# Patient Record
Sex: Male | Born: 1980 | Race: White | Hispanic: No | Marital: Married | Smoking: Never smoker
Health system: Southern US, Community
[De-identification: ages and names within clinical notes are randomized; demographics above are authoritative.]

## PROBLEM LIST (undated history)

## (undated) HISTORY — PX: WISDOM TOOTH EXTRACTION: SHX21

## (undated) HISTORY — PX: POPLITEAL SYNOVIAL CYST EXCISION: SUR555

---

## 2017-09-04 ENCOUNTER — Other Ambulatory Visit: Payer: Self-pay

## 2017-09-04 ENCOUNTER — Ambulatory Visit
Admission: EM | Admit: 2017-09-04 | Discharge: 2017-09-04 | Disposition: A | Discharge status: Home / Self Care | Payer: 59 | Attending: Family Medicine | Admitting: Family Medicine

## 2017-09-04 ENCOUNTER — Encounter: Payer: Self-pay | Admitting: Emergency Medicine

## 2017-09-04 DIAGNOSIS — R05 Cough: Secondary | ICD-10-CM

## 2017-09-04 DIAGNOSIS — H6691 Otitis media, unspecified, right ear: Secondary | ICD-10-CM | POA: Diagnosis not present

## 2017-09-04 DIAGNOSIS — R059 Cough, unspecified: Secondary | ICD-10-CM

## 2017-09-04 DIAGNOSIS — J0110 Acute frontal sinusitis, unspecified: Secondary | ICD-10-CM | POA: Diagnosis not present

## 2017-09-04 MED ORDER — HYDROCOD POLST-CPM POLST ER 10-8 MG/5ML PO SUER: 5.0000 mL | Freq: Every evening | ORAL | 0 refills | Status: DC | PRN

## 2017-09-04 MED ORDER — CEFDINIR 300 MG PO CAPS: 300.0000 mg | ORAL_CAPSULE | Freq: Two times a day (BID) | ORAL | 0 refills | Status: DC

## 2017-09-04 NOTE — ED Triage Notes (Signed)
Patient c/o sinus congestion and pressure and right ear pressure for the past week.  Patient denies fevers.

## 2017-09-04 NOTE — ED Provider Notes (Signed)
[facility] ____________________________________________  Time seen: Approximately 10:02 AM  I have reviewed the triage vital signs and the nursing notes.   HISTORY  Chief Complaint Otalgia (right ear) and Sinus Problem   HPI Jesse Carter is a 36 y.o. male presenting for evaluation of 1.5 weeks of nasal congestion, postnasal drainage, sinus pressure with gradual onset of cough and right ear pain.  States that he recently flew this week which cause ear pain to increase.  States cough primarily at night and interrupting sleep.  Denies known fevers.  Reports multiple sick contacts at work.  States occasionally noticed some drainage from right ear, denies a large amount of drainage or bleeding from right ear.  States ear feels clogged.  States right ear pain is mild to moderate.  States sinus pressure is mild to moderate.  States blowing nose and getting very thick greenish nasal drainage.  Reports continues to eat and drink well.  Reports his continue remain active.  Denies recent sickness.  Denies other aggravating or alleviating factors.  States has been taking some intermittent over-the-counter cough and congestion medications with minimal improvement, no resolution.  Denies chest pain, shortness of breath, abdominal pain, or rash. Denies recent sickness. Denies recent antibiotic use.  Reports penicillin allergic, states penicillin causes a rash, no swelling.  Denies any previous other allergies to antibiotic use.   History reviewed. No pertinent past medical history. Denies There are no active problems to display for this patient.   History reviewed. No pertinent surgical history.   No current facility-administered medications for this encounter.   Current Outpatient Medications:  .  cefdinir (OMNICEF) 300 MG capsule, Take 1 capsule (300 mg total) by mouth 2 (two) times daily., Disp: 20 capsule, Rfl: 0 .  chlorpheniramine-HYDROcodone (TUSSIONEX PENNKINETIC ER) 10-8 MG/5ML  SUER, Take 5 mLs by mouth at bedtime as needed for cough. do not drive or operate machinery while taking as can cause drowsiness., Disp: 75 mL, Rfl: 0  Allergies Penicillins  Family History  Problem Relation Age of Onset  . Hypertension Father     Social History Social History   Tobacco Use  . Smoking status: Never Smoker  . Smokeless tobacco: Never Used  Substance Use Topics  . Alcohol use: Yes  . Drug use: No    Review of Systems Constitutional: No fever/chills ENT initially had a sore throat, no sore throat now. Cardiovascular: Denies chest pain. Respiratory: Denies shortness of breath. Gastrointestinal: No abdominal pain.  No nausea, no vomiting.  No diarrhea.   Musculoskeletal: Negative for back pain. Skin: Negative for rash.   ____________________________________________   PHYSICAL EXAM:  VITAL SIGNS: ED Triage Vitals  Enc Vitals Group     BP 09/04/17 0939 122/75     Pulse Rate 09/04/17 0939 85     Resp 09/04/17 0939 16     Temp 09/04/17 0939 98.3 F (36.8 C)     Temp Source 09/04/17 0939 Oral     SpO2 09/04/17 0939 98 %     Weight 09/04/17 0935 240 lb (108.9 kg)     Height 09/04/17 0935 6\' 1"  (1.854 m)     Head Circumference --      Peak Flow --      Pain Score 09/04/17 0936 8     Pain Loc --      Pain Edu? --      Excl. in [place]? --    Constitutional: Alert and oriented. Well appearing and in no acute distress. Eyes:  Conjunctivae are normal.  Head: Atraumatic.Mild tenderness to palpation bilateral frontal and nontender maxillary sinuses. No swelling. No erythema.   Ears: Left: Nontender, no erythema, normal canal, normal TM.  Right: Nontender, normal canal, no drainage, moderate erythema and dull TM, TM appears intact.  No surrounding redness, swelling or erythema bilaterally.  Nose: nasal congestion with bilateral nasal turbinate erythema and edema.   Mouth/Throat: Mucous membranes are moist. Oropharynx non-erythematous. No tonsillar swelling or  exudate.  Neck: No stridor.  No cervical spine tenderness to palpation.  Hematological/Lymphatic/Immunilogical: No cervical lymphadenopathy. Cardiovascular: Normal rate, regular rhythm. Grossly normal heart sounds.  Good peripheral circulation. Respiratory: Normal respiratory effort.  No retractions. No wheezes, rales or rhonchi. Good air movement.  Gastrointestinal: Soft and nontender.  Musculoskeletal: No cervical, thoracic or lumbar tenderness to palpation.  Neurologic:  Normal speech and language. No gross focal neurologic deficits are appreciated. No gait instability. Skin:  Skin is warm, dry and intact. No rash noted. Psychiatric: Mood and affect are normal. Speech and behavior are normal.  ___________________________________________   LABS (all labs ordered are listed, but only abnormal results are displayed)  Labs Reviewed - No data to display   PROCEDURES Procedures   INITIAL IMPRESSION / ASSESSMENT AND PLAN / ED COURSE  Pertinent labs & imaging results that were available during my care of the patient were reviewed by me and considered in my medical decision making (see chart for details).  Well-appearing patient.  No acute distress. Suspect sinusitis and patient with right otitis media.  No TM rupture.  Patient reports previous penicillin allergy, rash only.  Will treat patient with oral Cefdinir, cautioned regarding monitoring for any reaction. Encourage over-the-counter Sudafed, encourage rest, fluids, supportive care, as needed Tussionex. Discussed indication, risks and benefits of medications with patient.   Discussed follow up with Primary care physician this week. Discussed follow up and return parameters including no resolution or any worsening concerns. Patient verbalized understanding and agreed to plan.   ____________________________________________   FINAL CLINICAL IMPRESSION(S) / ED DIAGNOSES  Final diagnoses:  Acute frontal sinusitis, recurrence not  specified  Right otitis media, unspecified otitis media type  Cough     ED Discharge Orders        Ordered    cefdinir (OMNICEF) 300 MG capsule  2 times daily     09/04/17 1010    chlorpheniramine-HYDROcodone (TUSSIONEX PENNKINETIC ER) 10-8 MG/5ML SUER  At bedtime PRN     09/04/17 1010       Note: This dictation was prepared with Dragon dictation along with smaller phrase technology. Any transcriptional errors that result from this process are unintentional.         Renford DillsMiller, [place] Linhart, NP 09/04/17 1025

## 2017-09-04 NOTE — Discharge Instructions (Signed)
Take medication as prescribed. Rest. Drink plenty of fluids.  ° °Follow up with your primary care physician this week as needed. Return to Urgent care for new or worsening concerns.  ° °

## 2019-08-31 ENCOUNTER — Ambulatory Visit
Admission: EM | Admit: 2019-08-31 | Discharge: 2019-08-31 | Disposition: A | Discharge status: Home / Self Care | Payer: BC Managed Care – PPO | Attending: Family Medicine | Admitting: Family Medicine

## 2019-08-31 ENCOUNTER — Other Ambulatory Visit: Payer: Self-pay

## 2019-08-31 DIAGNOSIS — R438 Other disturbances of smell and taste: Secondary | ICD-10-CM | POA: Diagnosis not present

## 2019-08-31 DIAGNOSIS — R05 Cough: Secondary | ICD-10-CM

## 2019-08-31 DIAGNOSIS — R0981 Nasal congestion: Secondary | ICD-10-CM

## 2019-08-31 DIAGNOSIS — J3489 Other specified disorders of nose and nasal sinuses: Secondary | ICD-10-CM | POA: Diagnosis not present

## 2019-08-31 DIAGNOSIS — U071 COVID-19: Secondary | ICD-10-CM

## 2019-08-31 LAB — SARS CORONAVIRUS 2 AG (30 MIN TAT): SARS Coronavirus 2 Ag: POSITIVE — AB

## 2019-08-31 NOTE — ED Triage Notes (Signed)
Patient states that he has cough, sinus pressure and pain, loss of taste and smell. Exposure from friends.

## 2019-08-31 NOTE — ED Provider Notes (Signed)
[facility]    CSN: [id number] Arrival date & time: 08/31/19  1506      History   Chief Complaint Chief Complaint  Patient presents with  . Cough    Appt    HPI  38 year old male presents with the above complaint.  Patient reports that he has been experiencing symptoms since Friday.  Reports cough, sinus pressure and congestion, and more recently loss of taste and smell.  He has had exposure to COVID-19.  No documented fever.  His wife has been sick as well.  No medications or interventions tried.  No known exacerbating/relieving factors.  Concern for COVID-19.  No other complaints.  Hx reviewed as below. PMH: Obesity  Surgical Hx: Removal of Baker's cyst   Home Medications    Prior to Admission medications   Not on File    Family History Family History  Problem Relation Age of Onset  . Hypertension Father     Social History Social History   Tobacco Use  . Smoking status: Never Smoker  . Smokeless tobacco: Never Used  Substance Use Topics  . Alcohol use: Yes  . Drug use: No     Allergies   Penicillins   Review of Systems Review of Systems  Constitutional: Negative for fever.  HENT: Positive for congestion, sinus pressure and sinus pain.        Loss of taste and smell.  Respiratory: Negative.    Physical Exam Triage Vital Signs ED Triage Vitals  Enc Vitals Group     BP 08/31/19 1527 132/90     Pulse Rate 08/31/19 1527 (!) 101     Resp 08/31/19 1527 18     Temp 08/31/19 1527 98.4 F (36.9 C)     Temp Source 08/31/19 1527 Oral     SpO2 08/31/19 1527 97 %     Weight --      Height 08/31/19 1528 6\' 1"  (1.854 m)     Head Circumference --      Peak Flow --      Pain Score 08/31/19 1528 0     Pain Loc --      Pain Edu? --      Excl. in [place]? --    Updated Vital Signs BP 132/90 (BP Location: Left Arm)   Pulse (!) 101   Temp 98.4 F (36.9 C) (Oral)   Resp 18   Ht 6\' 1"  (1.854 m)   SpO2 97%   BMI 31.66 kg/m   Visual Acuity  Right Eye Distance:   Left Eye Distance:   Bilateral Distance:    Right Eye Near:   Left Eye Near:    Bilateral Near:     Physical Exam Vitals signs and nursing note reviewed.  Constitutional:      General: He is not in acute distress.    Appearance: Normal appearance. He is not ill-appearing.  HENT:     Head: Normocephalic and atraumatic.  Eyes:     General:        Right eye: No discharge.        Left eye: No discharge.     Conjunctiva/sclera: Conjunctivae normal.  Cardiovascular:     Rate and Rhythm: Regular rhythm. Tachycardia present.  Pulmonary:     Effort: Pulmonary effort is normal.     Breath sounds: Normal breath sounds. No wheezing, rhonchi or rales.  Neurological:     Mental Status: He is alert.  Psychiatric:  Mood and Affect: Mood normal.        Behavior: Behavior normal.    UC Treatments / Results  Labs (all labs ordered are listed, but only abnormal results are displayed) Labs Reviewed  SARS CORONAVIRUS 2 AG (30 MIN TAT) - Abnormal; Notable for the following components:      Result Value   SARS Coronavirus 2 Ag POSITIVE (*)    All other components within normal limits    EKG   Radiology No results found.  Procedures Procedures (including critical care time)  Medications Ordered in UC Medications - No data to display  Initial Impression / Assessment and Plan / UC Course  I have reviewed the triage vital signs and the nursing notes.  Pertinent labs & imaging results that were available during my care of the patient were reviewed by me and considered in my medical decision making (see chart for details).    38 year old male presents with COVID-19.  Work note given. Precautions given. Supportive care. His contacts are coming in for testing today. Advised that his family needs to be tested.  Final Clinical Impressions(s) / UC Diagnoses   Final diagnoses:  COVID-19   Discharge Instructions   None    ED Prescriptions    None      PDMP not reviewed this encounter.   Tommie Sams, [place] 08/31/19 2100

## 2019-09-09 ENCOUNTER — Ambulatory Visit
Admission: RE | Admit: 2019-09-09 | Discharge: 2019-09-09 | Disposition: A | Discharge status: Home / Self Care | Payer: BC Managed Care – PPO | Source: Ambulatory Visit | Attending: Internal Medicine | Admitting: Internal Medicine

## 2019-09-09 ENCOUNTER — Other Ambulatory Visit: Payer: Self-pay

## 2019-09-09 ENCOUNTER — Other Ambulatory Visit: Payer: Self-pay | Admitting: Internal Medicine

## 2019-09-09 DIAGNOSIS — U071 COVID-19: Secondary | ICD-10-CM

## 2019-09-09 DIAGNOSIS — J208 Acute bronchitis due to other specified organisms: Secondary | ICD-10-CM | POA: Insufficient documentation

## 2019-09-09 DIAGNOSIS — I2699 Other pulmonary embolism without acute cor pulmonale: Secondary | ICD-10-CM | POA: Diagnosis present

## 2019-09-09 MED ORDER — IOHEXOL 350 MG/ML SOLN
75.0000 mL | Freq: Once | INTRAVENOUS | Status: AC | PRN
  Administered 2019-09-09: 14:00:00 75 mL via INTRAVENOUS

## 2020-05-06 IMAGING — CT CT ANGIO CHEST
2 of 6 series · 18 of 46 positions shown · IV contrast (APPLIED)
Comparison: None.

CLINICAL DATA: [id number] positive.  Shortness of breath

EXAM:
CT ANGIOGRAPHY CHEST WITH CONTRAST
TECHNIQUE: Multidetector CT imaging of the chest was performed using the
standard protocol during bolus administration of intravenous
contrast. Multiplanar CT image reconstructions and MIPs were
obtained to evaluate the vascular anatomy.
CONTRAST:  75mL OMNIPAQUE IOHEXOL 350 MG/ML SOLN

[Series 5: thins · axial · 0.74mm/px · z∈[-294,-25]mm · 16 of 295 slices shown]
[im 13/295  lung]
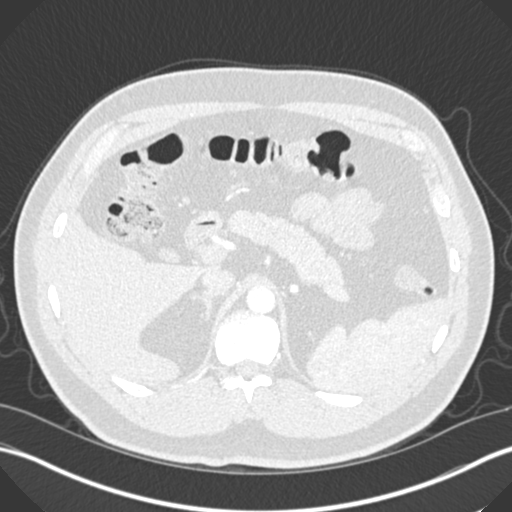
[im 39/295  soft-tissue]
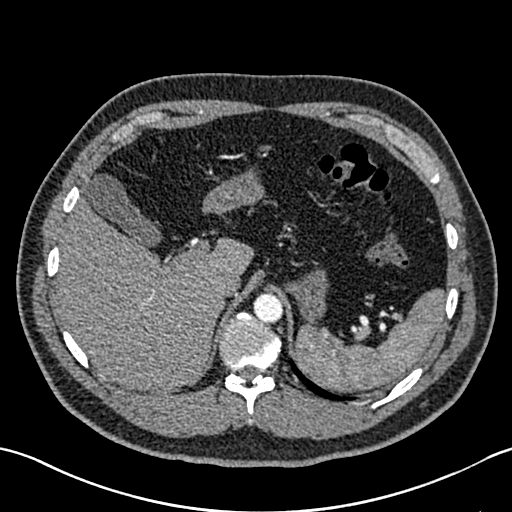
[im 52/295  lung]
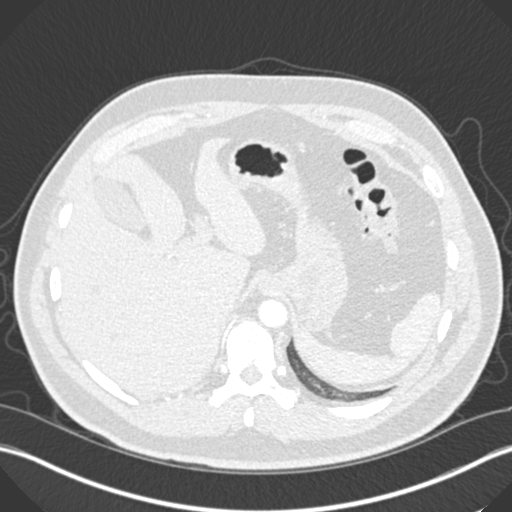
[im 64/295  soft-tissue]
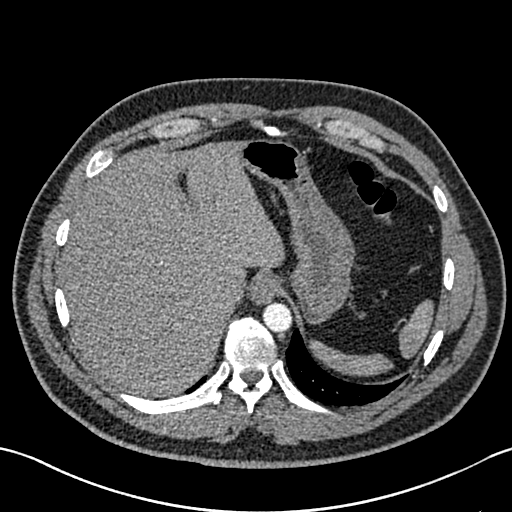
[im 90/295  lung]
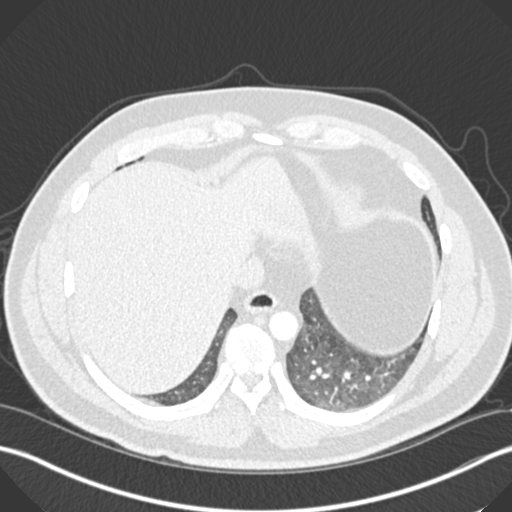
[im 103/295  soft-tissue]
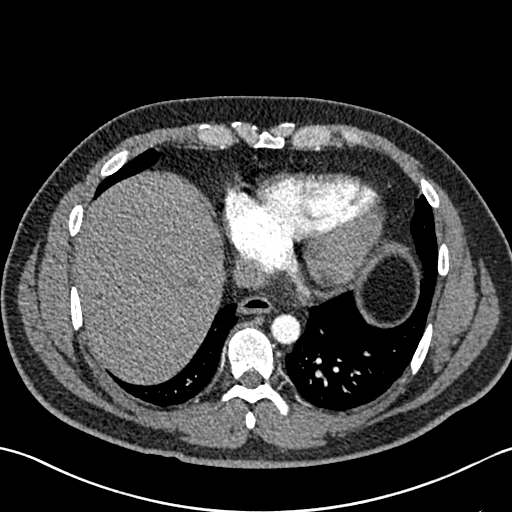
[im 116/295  lung]
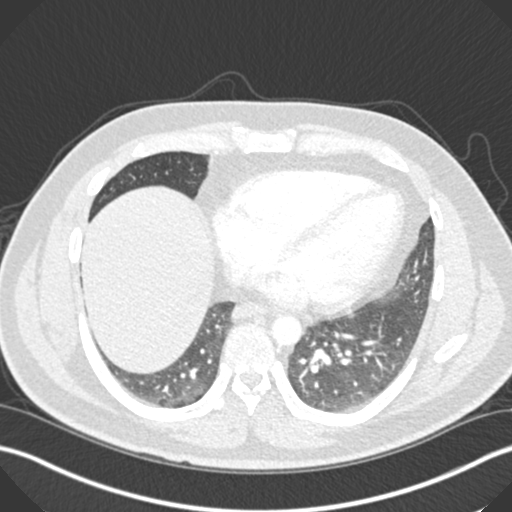
[im 141/295  soft-tissue]
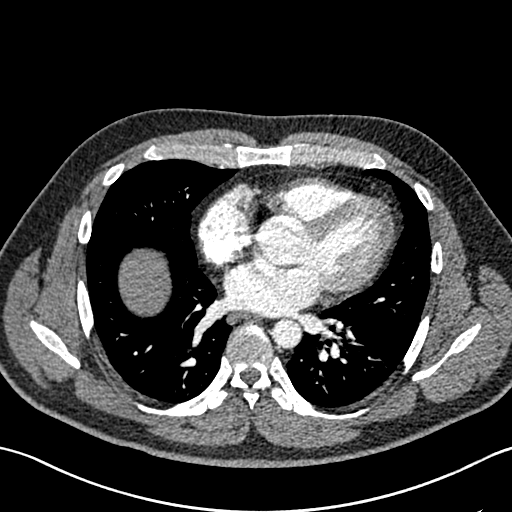
[im 154/295  lung]
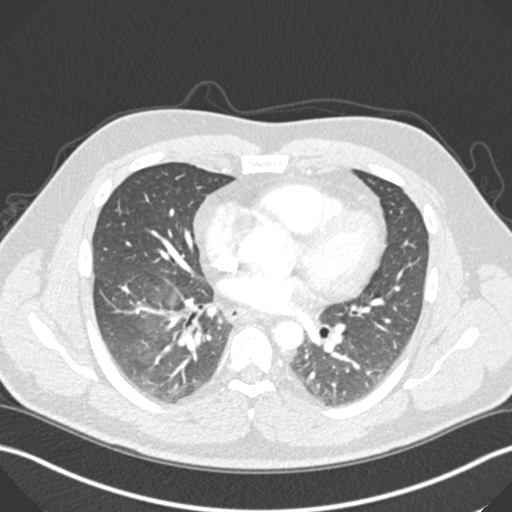
[im 179/295  soft-tissue]
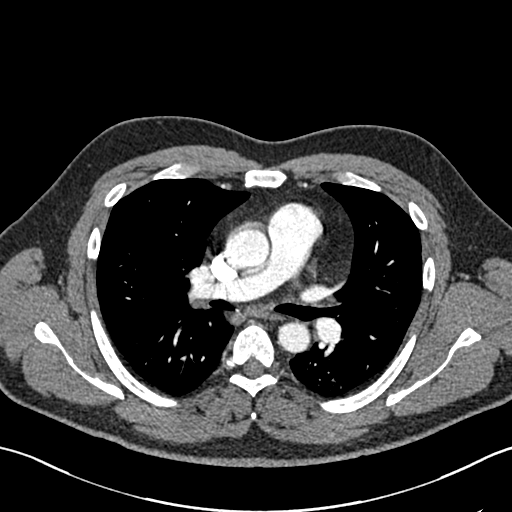
[im 192/295  lung]
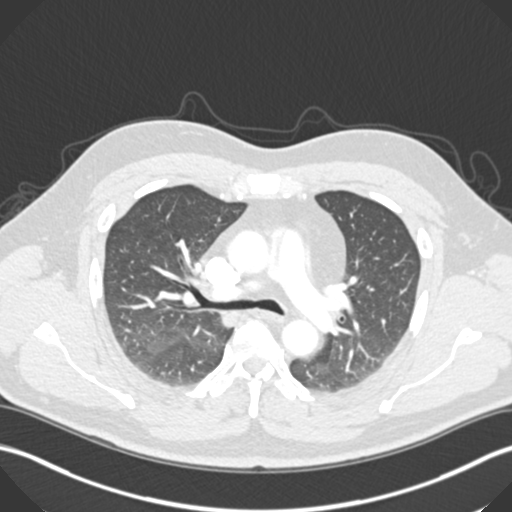
[im 205/295  soft-tissue]
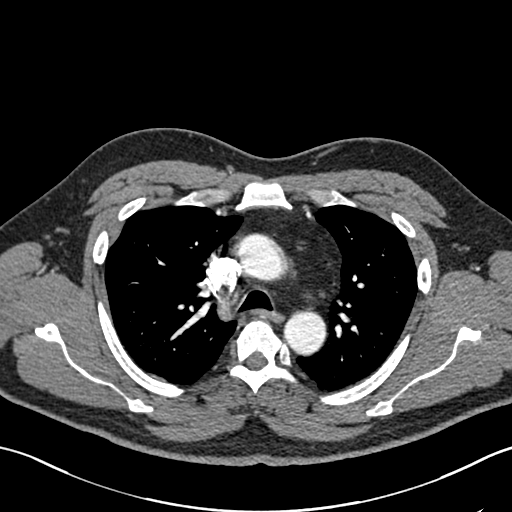
[im 231/295  lung]
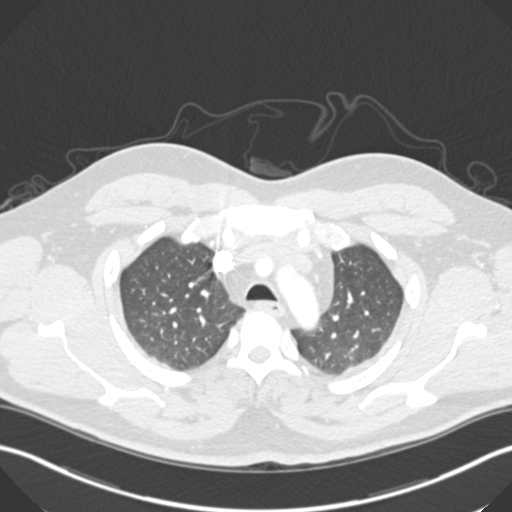
[im 243/295  soft-tissue]
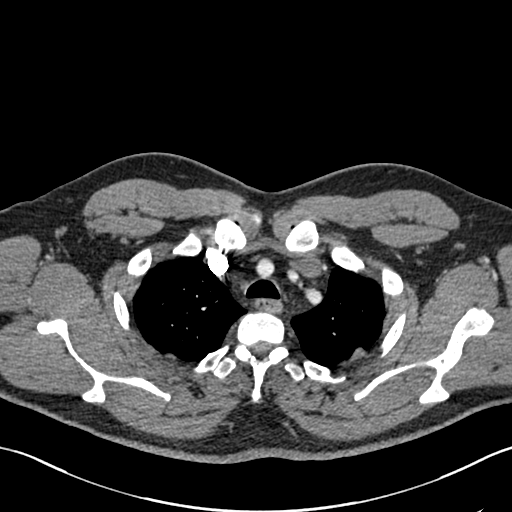
[im 256/295  lung]
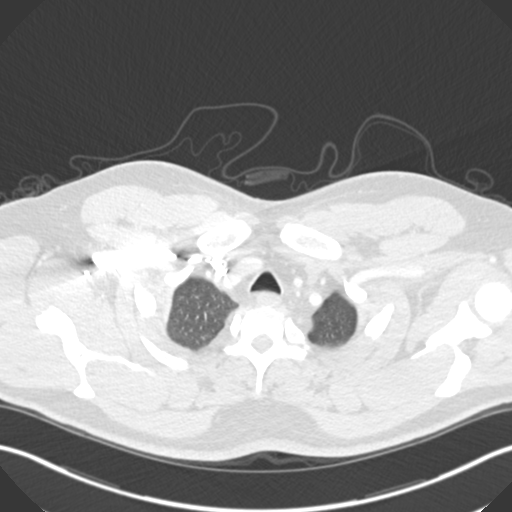
[im 282/295  soft-tissue]
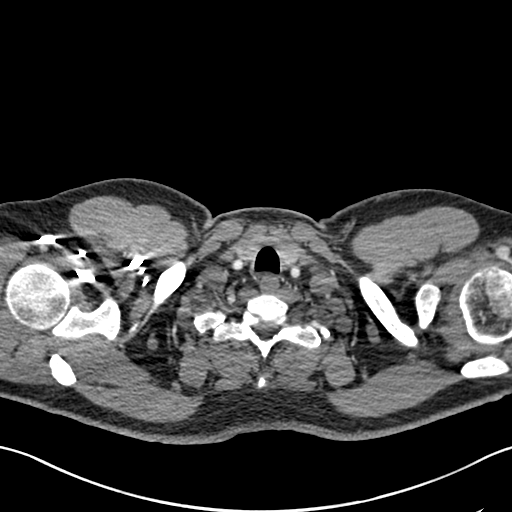

[Series 7: coronal mpr · coronal · 0.58mm/px · 2 of 89 slices shown]
[im 30/89  soft-tissue]
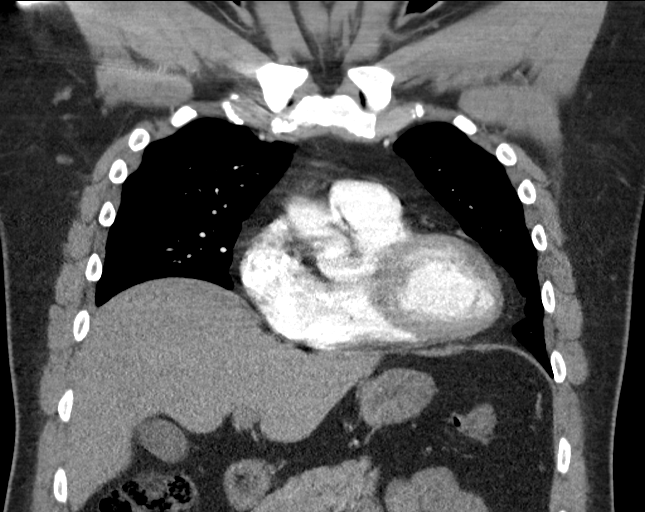
[im 59/89  soft-tissue]
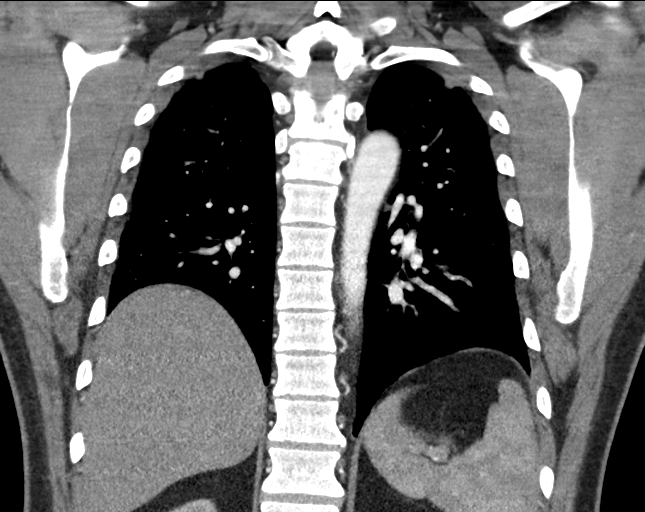

[18 of 46 positions shown; findings below may reference images not displayed]

FINDINGS: Cardiovascular: There is no demonstrable pulmonary embolus. There is
no thoracic aortic aneurysm or dissection. The visualized great
vessels appear normal. There is no appreciable pericardial effusion
or pericardial thickening.

Mediastinum/Nodes: Visualized thyroid appears normal. There is no
appreciable thoracic adenopathy. No esophageal lesions are
appreciable.

Lungs/Pleura: There is mild lower lobe atelectatic change. There is
no appreciable ground-glass type opacity or airspace consolidation.
No interstitial thickening evident. No pleural effusions.

Upper Abdomen: There are scattered subcentimeter apparent cysts
throughout the liver. Visualized upper abdominal structures
otherwise appear normal.

Musculoskeletal: There are no blastic or lytic bone lesions. No
chest wall lesions are evident.

Review of the MIP images confirms the above findings.
IMPRESSION: 1. No demonstrable pulmonary embolus. No thoracic aortic aneurysm or
dissection.

2. Areas of lower lobe atelectasis. No airspace consolidation or
ground-glass type opacity evident. No pleural effusions.

3.  No evident adenopathy.

4.  Probable small cysts throughout the liver.

## 2021-07-25 ENCOUNTER — Ambulatory Visit: Payer: Self-pay | Admitting: General Surgery

## 2021-07-25 NOTE — H&P (Signed)
PATIENT PROFILE: Jesse Carter is a 40 y.o. male who presents to the Clinic for consultation at the request of Dr. Sabra Heck for evaluation of umbilical hernia.  PCP:  Yevonne Pax, MD  HISTORY OF PRESENT ILLNESS: Jesse Carter reports having an umbilical hernia since 2 years ago.  He endorses that he has been having more discomfort on the periumbilical area.  No pain radiation.  Aggravating factor is certain abdominal wall movement and lifting.  No alleviating factors.  Pain does not radiate to other part of the body.  Patient denies any previous surgery on the periumbilical area.  Patient denies any episode of abdominal distention nausea or vomiting.   PROBLEM LIST: Problem List  Date Reviewed: 09/09/2019          Noted   B12 deficiency 11/28/2020   Overview    283, 2/22      History of 2019 novel coronavirus disease (COVID-19) 09/09/2019   Overview    12/20, 1/22          GENERAL REVIEW OF SYSTEMS:   General ROS: negative for - chills, fatigue, fever, weight gain or weight loss Allergy and Immunology ROS: negative for - hives  Hematological and Lymphatic ROS: negative for - bleeding problems or bruising, negative for palpable nodes Endocrine ROS: negative for - heat or cold intolerance, hair changes Respiratory ROS: negative for - cough, shortness of breath or wheezing Cardiovascular ROS: no chest pain or palpitations GI ROS: negative for nausea, vomiting, abdominal pain, diarrhea, constipation Musculoskeletal ROS: negative for - joint swelling or muscle pain Neurological ROS: negative for - confusion, syncope Dermatological ROS: negative for pruritus and rash Psychiatric: negative for anxiety, depression, difficulty sleeping and memory loss  MEDICATIONS: Current Outpatient Medications  Medication Sig Dispense Refill   ascorbic acid, vitamin C, (VITAMIN C) 1000 MG tablet Take 1,000 mg by mouth once daily     cholecalciferol (VITAMIN D3) 1000 unit tablet Take by  mouth 4,ooo     Herbal Supplement Herbal Name:  Multi vitamin and zinc.     No current facility-administered medications for this visit.    ALLERGIES: Penicillin and Trimox [amoxicillin]  PAST MEDICAL HISTORY: History reviewed. No pertinent past medical history.  PAST SURGICAL HISTORY: Past Surgical History:  Procedure Laterality Date   bakers cyst removed       FAMILY HISTORY: Family History  Problem Relation Age of Onset   No Known Problems Mother    High blood pressure (Hypertension) Father      SOCIAL HISTORY: Social History   Socioeconomic History   Marital status: Married  Tobacco Use   Smoking status: Never Smoker   Smokeless tobacco: Never Used  Scientific laboratory technician Use: Never used  Substance and Sexual Activity   Alcohol use: Yes    Alcohol/week: 0.0 standard drinks    Comment: occasional   Drug use: Never   Sexual activity: Defer    Partners: Female    PHYSICAL EXAM: Vitals:   07/25/21 1355  BP: 135/85  Pulse: 75   Body mass index is 33.51 kg/m. Weight: (!) 115.2 kg (254 lb)   GENERAL: Alert, active, oriented x3  HEENT: Pupils equal reactive to light. Extraocular movements are intact. Sclera clear. Palpebral conjunctiva normal red color.Pharynx clear.  NECK: Supple with no palpable mass and no adenopathy.  LUNGS: Sound clear with no rales rhonchi or wheezes.  HEART: Regular rhythm S1 and S2 without murmur.  ABDOMEN: Soft and depressible, nontender with no palpable mass,  no hepatomegaly.  2 cm umbilical hernia defect, reducible.  EXTREMITIES: Well-developed well-nourished symmetrical with no dependent edema.  NEUROLOGICAL: Awake alert oriented, facial expression symmetrical, moving all extremities.  REVIEW OF DATA: I have reviewed the following data today: Appointment on 07/17/2021  Component Date Value   Hemoglobin A1C 07/17/2021 5.7 (!)   Average Blood Glucose (C* 07/17/2021 117    Glucose 07/17/2021 100    Sodium 07/17/2021 141     Potassium 07/17/2021 4.1    Chloride 07/17/2021 102    Carbon Dioxide (CO2) 07/17/2021 30.4    Urea Nitrogen (BUN) 07/17/2021 17    Creatinine 07/17/2021 1.1    Glomerular Filtration Ra* 07/17/2021 75    Calcium 07/17/2021 9.5    AST  07/17/2021 16    ALT  07/17/2021 28    Alk Phos (alkaline Phosp* 07/17/2021 99    Albumin 07/17/2021 4.6    Bilirubin, Total 07/17/2021 0.8    Protein, Total 07/17/2021 6.8    A/G Ratio 07/17/2021 2.1    Hemoglobin 07/17/2021 16.4    Vitamin B12 07/17/2021 269 (!)     ASSESSMENT: Jesse Carter is a 40 y.o. male presenting for consultation for umbilical hernia.    The patient presents with a symptomatic umbilical hernia. Patient was oriented about the diagnosis of umbilical hernia and its implication. The patient was oriented about the treatment alternatives (observation vs surgical repair). Due to patient symptoms, repair is recommended. Patient oriented about the surgical procedure, the use of mesh and its risk of complications such as: infection, bleeding, injury to vasculature, injury to bowel or bladder, and chronic pain, intestinal obstruction, among others.   Umbilical hernia without obstruction and without gangrene [K42.9]  PLAN: Robotic assisted laparoscopic umbilical hernia repair with mesh (79024) Avoid taking aspirin 5 days before the procedure Contact us if you have any concern.   Patient verbalized understanding, all questions were answered, and were agreeable with the plan outlined above.    Herbert Pun, MD  Electronically signed by Herbert Pun, MD

## 2021-08-08 ENCOUNTER — Other Ambulatory Visit
Admission: RE | Admit: 2021-08-08 | Discharge: 2021-08-08 | Disposition: A | Discharge status: Home / Self Care | Payer: BC Managed Care – PPO | Source: Ambulatory Visit | Attending: General Surgery | Admitting: General Surgery

## 2021-08-08 NOTE — Patient Instructions (Addendum)
Your procedure is scheduled on:  Friday 08/15/21 Report to the Registration Desk on the 1st floor of the [facility]. To find out your arrival time, please call [phone] between 1PM - 3PM on: Thursday 08/14/21  REMEMBER: Instructions that are not followed completely may result in serious medical risk, up to and including death; or upon the discretion of your surgeon and anesthesiologist your surgery may need to be rescheduled.  Do not eat food after midnight the night before surgery.  No gum chewing, lozengers or hard candies.  TAKE THESE MEDICATIONS THE MORNING OF SURGERY WITH A SIP OF WATER: None  One week prior to surgery: Stop Anti-inflammatories (NSAIDS) such as Advil, Aleve, Ibuprofen, Motrin, Naproxen, Naprosyn and Aspirin based products such as Excedrin, Goodys Powder, BC Powder. Stop taking Ascorbic Acid (VITAMIN C PO, Cholecalciferol (VITAMIN D3 PO), Multiple Vitamins-Minerals (MULTIVITAMIN WITH MINERALS) tablet, Probiotic Product (PROBIOTIC DAILY PO and zinc gluconate 50 MG tablet and ANY OVER THE COUNTER supplements until after surgery. You may however, continue to take Tylenol if needed for pain up until the day of surgery.  No Alcohol for 24 hours before or after surgery.  No Smoking including e-cigarettes for 24 hours prior to surgery.  No chewable tobacco products for at least 6 hours prior to surgery.  No nicotine patches on the day of surgery.  Do not use any "recreational" drugs for at least a week prior to your surgery.  Please be advised that the combination of cocaine and anesthesia may have negative outcomes, up to and including death. If you test positive for cocaine, your surgery will be cancelled.  On the morning of surgery brush your teeth with toothpaste and water, you may rinse your mouth with mouthwash if you wish. Do not swallow any toothpaste or mouthwash.  Use antibacterial soap the day prior to and day of surgery.  Do not wear jewelry.  Do not  wear lotions, powders, or cologne.   Do not shave body from the neck down 48 hours prior to surgery just in case you cut yourself which could leave a site for infection.  Also, freshly shaved skin may become irritated if using the CHG soap.  Contact lenses, hearing aids and dentures may not be worn into surgery.  Do not bring valuables to the hospital. [facility] is not responsible for any missing/lost belongings or valuables.   Notify your doctor if there is any change in your medical condition (cold, fever, infection).  Wear comfortable clothing (specific to your surgery type) to the hospital.  After surgery, you can help prevent lung complications by doing breathing exercises.  Take deep breaths and cough every 1-2 hours. Your doctor may order a device called an Incentive Spirometer to help you take deep breaths. When coughing or sneezing, hold a pillow firmly against your incision with both hands. This is called "splinting." Doing this helps protect your incision. It also decreases belly discomfort.  If you are being discharged the day of surgery, you will not be allowed to drive home. You will need a responsible adult (18 years or older) to drive you home and stay with you that night.   If you are taking public transportation, you will need to have a responsible adult (18 years or older) with you. Please confirm with your physician that it is acceptable to use public transportation.   Please call the Pre-admissions Testing Dept. at [phone] if you have any questions about these instructions.  Surgery Visitation Policy:  Patients undergoing a surgery or procedure may have one family member or support person with them as long as that person is not COVID-19 positive or experiencing its symptoms.  That person may remain in the waiting area during the procedure and may rotate out with other people.  Inpatient Visitation:    Visiting hours are 7 a.m. to 8 p.m. Up to two  visitors ages 16+ are allowed at one time in a patient room. The visitors may rotate out with other people during the day. Visitors must check out when they leave, or other visitors will not be allowed. One designated support person may remain overnight. The visitor must pass COVID-19 screenings, use hand sanitizer when entering and exiting the patient's room and wear a mask at all times, including in the patient's room. Patients must also wear a mask when staff or their visitor are in the room. Masking is required regardless of vaccination status.

## 2021-09-11 ENCOUNTER — Encounter
Admission: RE | Admit: 2021-09-11 | Discharge: 2021-09-11 | Disposition: A | Discharge status: Home / Self Care | Payer: BC Managed Care – PPO | Source: Ambulatory Visit | Attending: General Surgery | Admitting: General Surgery

## 2021-09-11 ENCOUNTER — Other Ambulatory Visit: Payer: Self-pay

## 2021-09-11 NOTE — Progress Notes (Signed)
  Perioperative Services Pre-Admission/Anesthesia Testing     Date: 09/11/21  Name: Jesse Carter MRN:   [id number]  Re: Change in ABX for upcoming surgery   Case: [id number] Date/Time: 09/14/21 0715   Procedure: XI ROBOT ASSISTED UMBILICAL HERNIA REPAIR (Abdomen)   Anesthesia type: General   Pre-op diagnosis: K42.9 Umbilical hernia w/o obstruction   Location: [facility] OR ROOM 06 / [facility]   Surgeons: Carolan Shiver, MD   Primary attending surgeon was consulted regarding consideration of therapeutic change in antimicrobial agent being used for preoperative prophylaxis in this patient's upcoming surgical case. Following analysis of the risk versus benefits, the patient's primary attending surgeon advised that it would be acceptable to discontinue the ordered clindamycin and place an order for cefazolin 2 gm IV on call to the OR. Orders for this patient were amended by me following collaborative conversation with attending surgeon taking into consideration of risk versus benefits associated with the change in therapy.  Quentin Mulling, MSN, APRN, FNP-C, CEN [facility]  Peri-operative Services Nurse Practitioner Phone: [phone] 09/11/21 11:23 AM

## 2021-09-11 NOTE — Patient Instructions (Signed)
Your procedure is scheduled on: 09/14/21 Report to DAY SURGERY DEPARTMENT LOCATED ON 2ND FLOOR MEDICAL MALL ENTRANCE. To find out your arrival time please call [phone] between 1PM - 3PM on 09/13/21.  Remember: Instructions that are not followed completely may result in serious medical risk, up to and including death, or upon the discretion of your surgeon and anesthesiologist your surgery may need to be rescheduled.     _X__ 1. Do not eat food after midnight the night before your procedure.                 No gum chewing or hard candies. You may drink clear liquids up to 2 hours                 before you are scheduled to arrive for your surgery- DO not drink clear                 liquids within 2 hours of the start of your surgery.                 Clear Liquids include:  water, apple juice without pulp, clear carbohydrate                 drink such as Clearfast or Gatorade, Black Coffee or Tea (Do not add                 anything to coffee or tea). Diabetics water only  __X__2.  On the morning of surgery brush your teeth with toothpaste and water, you                 may rinse your mouth with mouthwash if you wish.  Do not swallow any              toothpaste of mouthwash.     _X__ 3.  No Alcohol for 24 hours before or after surgery.   _X__ 4.  Do Not Smoke or use e-cigarettes For 24 Hours Prior to Your Surgery.                 Do not use any chewable tobacco products for at least 6 hours prior to                 surgery.  ____  5.  Bring all medications with you on the day of surgery if instructed.   __X__  6.  Notify your doctor if there is any change in your medical condition      (cold, fever, infections).     Do not wear jewelry, make-up, hairpins, clips or nail polish. Do not wear lotions, powders, or perfumes.  Do not shave 48 hours prior to surgery. Men may shave face and neck. Do not bring valuables to the hospital.    [facility] is not responsible for any belongings or  valuables.  Contacts, dentures/partials or body piercings may not be worn into surgery. Bring a case for your contacts, glasses or hearing aids, a denture cup will be supplied. Leave your suitcase in the car. After surgery it may be brought to your room. For patients admitted to the hospital, discharge time is determined by your treatment team.   Patients discharged the day of surgery will not be allowed to drive home.   Please read over the following fact sheets that you were given:     __X__ Take these medicines the morning of surgery with A SIP OF WATER:  1. none  2.   3.   4.  5.  6.  ____ Fleet Enema (as directed)   ____ Use CHG Soap/SAGE wipes as directed  ____ Use inhalers on the day of surgery  ____ Stop metformin/Janumet/Farxiga 2 days prior to surgery    ____ Take 1/2 of usual insulin dose the night before surgery. No insulin the morning          of surgery.   ____ Stop Blood Thinners Coumadin/Plavix/Xarelto/Pleta/Pradaxa/Eliquis/Effient/Aspirin  on   Or contact your Surgeon, Cardiologist or Medical Doctor regarding  ability to stop your blood thinners  __X__ Stop Anti-inflammatories 7 days before surgery such as Advil, Ibuprofen, Motrin,  BC or Goodies Powder, Naprosyn, Naproxen, Aleve, Aspirin    __X__ Stop all herbal supplements, fish oil or vitamin E until after surgery.  Stop all supplements today 09/11/21  ____ Bring C-Pap to the hospital.    Shower with "Synetta Shadow" Dial soap starting today thru the morning of surgery.

## 2021-09-11 NOTE — Progress Notes (Signed)
  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   Received cephalosporin with no documented complications Received CEFDINIR on 09/04/2017 without documented ADRs.   Screened as appropriate for cephalosporin use during medication reconciliation No immediate angioedema, dysphagia, SOB, anaphylaxis symptoms. No severe rash involving mucous membranes or skin necrosis. No hospital admissions related to side effects of PCN/cephalosporin use.   Request:  As an evidence based approach to reducing the rate of incidence for post-operative SSI and the development of MDROs, could an agent with narrower coverage for preoperative prophylaxis in this patient's upcoming surgical course be considered?   Currently ordered preoperative prophylactic ABX: clindamycin.   Specifically requesting change to cephalosporin (CEFAZOLIN).   Please communicate decision with me and I will change the orders in Epic as per your direction.   Things to consider: Many patients report that they were "allergic" to PCN earlier in life, however this does not translate into a true lifelong allergy. Patients can lose sensitivity to specific IgE antibodies over time if PCN is avoided (Kleris & Lugar, 2019).  Up to 10% of the adult population and 15%  of hospitalized patients report an allergy to PCN, however clinical studies suggest that 90% of those reporting an allergy can tolerate PCN antibiotics (Kleris & Lugar, 2019).  Cross-sensitivity between PCN and cephalosporins has been documented as being as high as 10%, however this estimation included data believed to have been collected in a setting where there was contamination. Newer data suggests that the prevalence of cross-sensitivity between PCN and cephalosporins is actually estimated to be closer to 1% (Hermanides et al., 2018).   Patients labeled as PCN allergic, whether they are truly allergic or not, have been found to have inferior outcomes in terms of rates of serious infection, and these patients tend to have longer hospital stays [facility] & Lugar, 2019).  Treatment related secondary infections, such as Clostridioides difficile, have been linked to the improper use of broad spectrum antibiotics in patients improperly labeled as PCN allergic (Kleris & Lugar, 2019).  Anaphylaxis from cephalosporins is rare and the evidence suggests that there is no increased risk of an anaphylactic type reaction when cephalosporins are used in a PCN allergic patient (Pichichero, 2006).  Citations: Hermanides J, Lemkes BA, Prins Gwenyth Bender MW, Terreehorst I. Presumed ?-Lactam Allergy and Cross-reactivity in the Operating Theater: A Practical Approach. Anesthesiology. 2018 Aug;129(2):335-342. doi: 10.1097/ALN.0000000000002252. PMID: 75170017.  Kleris, R. S., & Lugar, P. L. (2019). Things We Do For No Reason: Failing to Question a Penicillin Allergy History. Journal of hospital medicine, 14(10), 9161973113. Advance online publication. airportbarriers.com  Pichichero, M. E. (2006). Cephalosporins can be prescribed safely for penicillin-allergic patients. Journal of family medicine, 55(2), 106-112. Accessed: https://cdn.mdedge.com/files/s66fs-public/Document/September-2017/5502JFP_AppliedEvidence1.pdf    Quentin Mulling, MSN, APRN, FNP-C, CEN [facility]  Peri-operative Services Nurse Practitioner FAX: [phone] 09/11/21 10:56 AM

## 2021-09-13 MED ORDER — LACTATED RINGERS IV SOLN: INTRAVENOUS | Status: DC

## 2021-09-13 MED ORDER — CHLORHEXIDINE GLUCONATE 0.12 % MT SOLN
15.0000 mL | Freq: Once | OROMUCOSAL | Status: AC
  Administered 2021-09-14: 15 mL via OROMUCOSAL

## 2021-09-13 MED ORDER — ORAL CARE MOUTH RINSE: 15.0000 mL | Freq: Once | OROMUCOSAL | Status: AC

## 2021-09-13 MED ORDER — CEFAZOLIN SODIUM-DEXTROSE 2-4 GM/100ML-% IV SOLN
2.0000 g | Freq: Once | INTRAVENOUS | Status: AC
  Administered 2021-09-14: 2 g via INTRAVENOUS

## 2021-09-13 MED ORDER — FAMOTIDINE 20 MG PO TABS
20.0000 mg | ORAL_TABLET | Freq: Once | ORAL | Status: AC
  Administered 2021-09-14: 20 mg via ORAL

## 2021-09-14 ENCOUNTER — Encounter: Payer: Self-pay | Admitting: General Surgery

## 2021-09-14 ENCOUNTER — Ambulatory Visit: Payer: BC Managed Care – PPO | Admitting: Urgent Care

## 2021-09-14 ENCOUNTER — Other Ambulatory Visit: Payer: Self-pay

## 2021-09-14 ENCOUNTER — Encounter
Admission: RE | Disposition: A | Discharge status: Home / Self Care | Payer: Self-pay | Source: Home / Self Care | Attending: General Surgery

## 2021-09-14 ENCOUNTER — Ambulatory Visit
Admission: RE | Admit: 2021-09-14 | Discharge: 2021-09-14 | Disposition: A | Discharge status: Home / Self Care | Payer: BC Managed Care – PPO | Attending: General Surgery | Admitting: General Surgery

## 2021-09-14 DIAGNOSIS — Z6831 Body mass index (BMI) 31.0-31.9, adult: Secondary | ICD-10-CM | POA: Insufficient documentation

## 2021-09-14 DIAGNOSIS — K429 Umbilical hernia without obstruction or gangrene: Secondary | ICD-10-CM | POA: Insufficient documentation

## 2021-09-14 DIAGNOSIS — E669 Obesity, unspecified: Secondary | ICD-10-CM | POA: Diagnosis not present

## 2021-09-14 HISTORY — PX: INSERTION OF MESH: SHX5868

## 2021-09-14 SURGERY — REPAIR, HERNIA, UMBILICAL, ROBOT-ASSISTED
Anesthesia: General | Site: Abdomen

## 2021-09-14 MED ORDER — HYDROMORPHONE HCL 1 MG/ML IJ SOLN
INTRAMUSCULAR | Status: AC
  Filled 2021-09-14: qty 1

## 2021-09-14 MED ORDER — FENTANYL CITRATE (PF) 100 MCG/2ML IJ SOLN
INTRAMUSCULAR | Status: AC
  Administered 2021-09-14: 50 ug via INTRAVENOUS
  Filled 2021-09-14: qty 2

## 2021-09-14 MED ORDER — SIMETHICONE 80 MG PO CHEW
80.0000 mg | CHEWABLE_TABLET | Freq: Once | ORAL | Status: DC
  Filled 2021-09-14: qty 1

## 2021-09-14 MED ORDER — SUGAMMADEX SODIUM 500 MG/5ML IV SOLN
INTRAVENOUS | Status: AC
  Filled 2021-09-14: qty 5

## 2021-09-14 MED ORDER — SEVOFLURANE IN SOLN
RESPIRATORY_TRACT | Status: AC
  Filled 2021-09-14: qty 250

## 2021-09-14 MED ORDER — LIDOCAINE HCL (CARDIAC) PF 100 MG/5ML IV SOSY
PREFILLED_SYRINGE | INTRAVENOUS | Status: DC | PRN
  Administered 2021-09-14: 100 mg via INTRAVENOUS

## 2021-09-14 MED ORDER — ONDANSETRON HCL 4 MG/2ML IJ SOLN
INTRAMUSCULAR | Status: DC | PRN
  Administered 2021-09-14: 4 mg via INTRAVENOUS

## 2021-09-14 MED ORDER — ROCURONIUM BROMIDE 10 MG/ML (PF) SYRINGE
PREFILLED_SYRINGE | INTRAVENOUS | Status: AC
  Filled 2021-09-14: qty 30

## 2021-09-14 MED ORDER — ACETAMINOPHEN 10 MG/ML IV SOLN
INTRAVENOUS | Status: DC | PRN
  Administered 2021-09-14: 1000 mg via INTRAVENOUS

## 2021-09-14 MED ORDER — HYDROCODONE-ACETAMINOPHEN 5-325 MG PO TABS: 1.0000 | ORAL_TABLET | ORAL | 0 refills | Status: AC | PRN

## 2021-09-14 MED ORDER — ALBUTEROL SULFATE HFA 108 (90 BASE) MCG/ACT IN AERS
INHALATION_SPRAY | RESPIRATORY_TRACT | Status: AC
  Filled 2021-09-14: qty 6.7

## 2021-09-14 MED ORDER — BUPIVACAINE-EPINEPHRINE (PF) 0.25% -1:200000 IJ SOLN
INTRAMUSCULAR | Status: DC | PRN
  Administered 2021-09-14: 30 mL

## 2021-09-14 MED ORDER — FENTANYL CITRATE (PF) 100 MCG/2ML IJ SOLN
25.0000 ug | INTRAMUSCULAR | Status: DC | PRN
  Administered 2021-09-14 (×2): 25 ug via INTRAVENOUS

## 2021-09-14 MED ORDER — MIDAZOLAM HCL 2 MG/2ML IJ SOLN
INTRAMUSCULAR | Status: DC | PRN
Start: 2021-09-14 — End: 2021-09-14
  Administered 2021-09-14: 2 mg via INTRAVENOUS

## 2021-09-14 MED ORDER — PROPOFOL 10 MG/ML IV BOLUS
INTRAVENOUS | Status: DC | PRN
  Administered 2021-09-14: 200 mg via INTRAVENOUS
  Administered 2021-09-14 (×2): 50 mg via INTRAVENOUS

## 2021-09-14 MED ORDER — FAMOTIDINE 20 MG PO TABS
ORAL_TABLET | ORAL | Status: AC
  Filled 2021-09-14: qty 1

## 2021-09-14 MED ORDER — CEFAZOLIN SODIUM-DEXTROSE 2-4 GM/100ML-% IV SOLN
INTRAVENOUS | Status: AC
  Filled 2021-09-14: qty 100

## 2021-09-14 MED ORDER — KETAMINE HCL 10 MG/ML IJ SOLN
INTRAMUSCULAR | Status: DC | PRN
  Administered 2021-09-14: 20 mg via INTRAVENOUS

## 2021-09-14 MED ORDER — CHLORHEXIDINE GLUCONATE 0.12 % MT SOLN
OROMUCOSAL | Status: AC
  Filled 2021-09-14: qty 15

## 2021-09-14 MED ORDER — OXYCODONE HCL 5 MG/5ML PO SOLN: 5.0000 mg | Freq: Once | ORAL | Status: AC | PRN

## 2021-09-14 MED ORDER — DEXAMETHASONE SODIUM PHOSPHATE 10 MG/ML IJ SOLN
INTRAMUSCULAR | Status: AC
  Filled 2021-09-14: qty 1

## 2021-09-14 MED ORDER — DEXMEDETOMIDINE HCL IN NACL 200 MCG/50ML IV SOLN
INTRAVENOUS | Status: AC
  Filled 2021-09-14: qty 50

## 2021-09-14 MED ORDER — ONDANSETRON HCL 4 MG/2ML IJ SOLN: 4.0000 mg | Freq: Once | INTRAMUSCULAR | Status: DC | PRN

## 2021-09-14 MED ORDER — KETAMINE HCL 50 MG/5ML IJ SOSY
PREFILLED_SYRINGE | INTRAMUSCULAR | Status: AC
  Filled 2021-09-14: qty 5

## 2021-09-14 MED ORDER — OXYCODONE HCL 5 MG PO TABS
ORAL_TABLET | ORAL | Status: AC
  Administered 2021-09-14: 5 mg via ORAL
  Filled 2021-09-14: qty 1

## 2021-09-14 MED ORDER — FENTANYL CITRATE (PF) 100 MCG/2ML IJ SOLN
INTRAMUSCULAR | Status: AC
  Filled 2021-09-14: qty 2

## 2021-09-14 MED ORDER — PHENYLEPHRINE HCL (PRESSORS) 10 MG/ML IV SOLN
INTRAVENOUS | Status: AC
  Filled 2021-09-14: qty 1

## 2021-09-14 MED ORDER — DEXAMETHASONE SODIUM PHOSPHATE 10 MG/ML IJ SOLN
INTRAMUSCULAR | Status: DC | PRN
  Administered 2021-09-14: 10 mg via INTRAVENOUS

## 2021-09-14 MED ORDER — FENTANYL CITRATE (PF) 100 MCG/2ML IJ SOLN
INTRAMUSCULAR | Status: DC | PRN
  Administered 2021-09-14: 50 ug via INTRAVENOUS

## 2021-09-14 MED ORDER — BUPIVACAINE-EPINEPHRINE (PF) 0.25% -1:200000 IJ SOLN
INTRAMUSCULAR | Status: AC
  Filled 2021-09-14: qty 30

## 2021-09-14 MED ORDER — EPHEDRINE SULFATE 50 MG/ML IJ SOLN
INTRAMUSCULAR | Status: DC | PRN
  Administered 2021-09-14: 15 mg via INTRAVENOUS

## 2021-09-14 MED ORDER — DEXAMETHASONE SODIUM PHOSPHATE 10 MG/ML IJ SOLN
INTRAMUSCULAR | Status: AC
  Filled 2021-09-14: qty 3

## 2021-09-14 MED ORDER — SUGAMMADEX SODIUM 500 MG/5ML IV SOLN
INTRAVENOUS | Status: DC | PRN
  Administered 2021-09-14: 300 mg via INTRAVENOUS

## 2021-09-14 MED ORDER — HYDROMORPHONE HCL 1 MG/ML IJ SOLN
INTRAMUSCULAR | Status: DC | PRN
  Administered 2021-09-14: 1 mg via INTRAVENOUS

## 2021-09-14 MED ORDER — DEXMEDETOMIDINE HCL IN NACL 400 MCG/100ML IV SOLN
INTRAVENOUS | Status: DC | PRN
  Administered 2021-09-14: 12 ug via INTRAVENOUS
  Administered 2021-09-14: 8 ug via INTRAVENOUS

## 2021-09-14 MED ORDER — PROPOFOL 10 MG/ML IV BOLUS
INTRAVENOUS | Status: AC
  Filled 2021-09-14: qty 20

## 2021-09-14 MED ORDER — OXYCODONE HCL 5 MG PO TABS: 5.0000 mg | ORAL_TABLET | Freq: Once | ORAL | Status: AC | PRN

## 2021-09-14 MED ORDER — ACETAMINOPHEN 10 MG/ML IV SOLN
INTRAVENOUS | Status: AC
  Filled 2021-09-14: qty 100

## 2021-09-14 MED ORDER — MIDAZOLAM HCL 2 MG/2ML IJ SOLN
INTRAMUSCULAR | Status: AC
  Filled 2021-09-14: qty 2

## 2021-09-14 MED ORDER — ONDANSETRON HCL 4 MG/2ML IJ SOLN
INTRAMUSCULAR | Status: AC
  Filled 2021-09-14: qty 2

## 2021-09-14 MED ORDER — PHENYLEPHRINE HCL (PRESSORS) 10 MG/ML IV SOLN
INTRAVENOUS | Status: DC | PRN
Start: 2021-09-14 — End: 2021-09-14
  Administered 2021-09-14 (×4): 80 ug via INTRAVENOUS

## 2021-09-14 MED ORDER — ROCURONIUM BROMIDE 100 MG/10ML IV SOLN
INTRAVENOUS | Status: DC | PRN
Start: 2021-09-14 — End: 2021-09-14
  Administered 2021-09-14: 20 mg via INTRAVENOUS
  Administered 2021-09-14 (×2): 10 mg via INTRAVENOUS
  Administered 2021-09-14: 60 mg via INTRAVENOUS
  Administered 2021-09-14: 20 mg via INTRAVENOUS

## 2021-09-14 MED ORDER — LIDOCAINE HCL (PF) 2 % IJ SOLN
INTRAMUSCULAR | Status: AC
  Filled 2021-09-14: qty 20

## 2021-09-14 SURGICAL SUPPLY — 46 items
ADH SKN CLS APL DERMABOND .7 (GAUZE/BANDAGES/DRESSINGS) ×2
BAG INFUSER PRESSURE 100CC (MISCELLANEOUS) IMPLANT
BLADE SURG SZ11 CARB STEEL (BLADE) ×3 IMPLANT
COVER TIP SHEARS 8 DVNC (MISCELLANEOUS) ×2 IMPLANT
COVER TIP SHEARS 8MM DA VINCI (MISCELLANEOUS) ×1
DERMABOND ADVANCED (GAUZE/BANDAGES/DRESSINGS) ×1
DERMABOND ADVANCED .7 DNX12 (GAUZE/BANDAGES/DRESSINGS) ×2 IMPLANT
DRAPE ARM DVNC X/XI (DISPOSABLE) ×6 IMPLANT
DRAPE COLUMN DVNC XI (DISPOSABLE) ×2 IMPLANT
DRAPE DA VINCI XI ARM (DISPOSABLE) ×3
DRAPE DA VINCI XI COLUMN (DISPOSABLE) ×1
ELECT REM PT RETURN 9FT ADLT (ELECTROSURGICAL) ×3
ELECTRODE REM PT RTRN 9FT ADLT (ELECTROSURGICAL) ×2 IMPLANT
GAUZE 4X4 16PLY ~~LOC~~+RFID DBL (SPONGE) ×3 IMPLANT
GLOVE SURG ENC MOIS LTX SZ6.5 (GLOVE) ×12 IMPLANT
GLOVE SURG UNDER POLY LF SZ6.5 (GLOVE) ×12 IMPLANT
GOWN STRL REUS W/ TWL LRG LVL3 (GOWN DISPOSABLE) ×8 IMPLANT
GOWN STRL REUS W/TWL LRG LVL3 (GOWN DISPOSABLE) ×12
IRRIGATOR SUCT 8 DISP DVNC XI (IRRIGATION / IRRIGATOR) IMPLANT
IRRIGATOR SUCTION 8MM XI DISP (IRRIGATION / IRRIGATOR)
IV CATH ANGIO 12GX3 LT BLUE (NEEDLE) ×3 IMPLANT
IV NS 1000ML (IV SOLUTION)
IV NS 1000ML BAXH (IV SOLUTION) IMPLANT
KIT PINK PAD W/HEAD ARE REST (MISCELLANEOUS) ×3
KIT PINK PAD W/HEAD ARM REST (MISCELLANEOUS) ×2 IMPLANT
LABEL OR SOLS (LABEL) ×3 IMPLANT
MANIFOLD NEPTUNE II (INSTRUMENTS) ×3 IMPLANT
MESH PROGRIP HERNIA FLAT 30X15 (Mesh General) ×3 IMPLANT
NEEDLE HYPO 22GX1.5 SAFETY (NEEDLE) ×3 IMPLANT
NEEDLE INSUFFLATION 14GA 120MM (NEEDLE) ×3 IMPLANT
NS IRRIG 500ML POUR BTL (IV SOLUTION) ×3 IMPLANT
OBTURATOR OPTICAL STANDARD 8MM (TROCAR) ×1
OBTURATOR OPTICAL STND 8 DVNC (TROCAR) ×2
OBTURATOR OPTICALSTD 8 DVNC (TROCAR) ×2 IMPLANT
PACK LAP CHOLECYSTECTOMY (MISCELLANEOUS) ×3 IMPLANT
SEAL CANN UNIV 5-8 DVNC XI (MISCELLANEOUS) ×6 IMPLANT
SEAL XI 5MM-8MM UNIVERSAL (MISCELLANEOUS) ×3
SET TUBE SMOKE EVAC HIGH FLOW (TUBING) ×3 IMPLANT
SOLUTION ELECTROLUBE (MISCELLANEOUS) ×3 IMPLANT
SUT MNCRL 4-0 (SUTURE) ×3
SUT MNCRL 4-0 27XMFL (SUTURE) ×2
SUT STRATAFIX PDS 30 CT-1 (SUTURE) ×3 IMPLANT
SUT VICRYL 0 AB UR-6 (SUTURE) ×3 IMPLANT
SUT VLOC 90 2/L VL 12 GS22 (SUTURE) ×6 IMPLANT
SUTURE MNCRL 4-0 27XMF (SUTURE) ×2 IMPLANT
WATER STERILE IRR 500ML POUR (IV SOLUTION) ×3 IMPLANT

## 2021-09-14 NOTE — Anesthesia Procedure Notes (Signed)
Procedure Name: Intubation Date/Time: 09/14/2021 7:43 AM Performed by: Carter Kitten, CRNA Pre-anesthesia Checklist: Patient identified, Patient being monitored, Timeout performed, Emergency Drugs available and Suction available Patient Re-evaluated:Patient Re-evaluated prior to induction Oxygen Delivery Method: Circle system utilized Preoxygenation: Pre-oxygenation with 100% oxygen Induction Type: IV induction Ventilation: Mask ventilation without difficulty Laryngoscope Size: 3, McGraph and 4 Grade View: Grade II Tube type: Oral Tube size: 7.0 mm Number of attempts: 2 Airway Equipment and Method: Stylet Placement Confirmation: ETT inserted through vocal cords under direct vision, positive ETCO2 and breath sounds checked- equal and bilateral Secured at: 21 cm Tube secured with: Tape Dental Injury: Teeth and Oropharynx as per pre-operative assessment

## 2021-09-14 NOTE — Transfer of Care (Addendum)
Immediate Anesthesia Transfer of Care Note  Patient: Jesse Carter  Procedure(s) Performed: XI ROBOT ASSISTED UMBILICAL HERNIA REPAIR (Abdomen)  Patient Location: PACU  Anesthesia Type:General  Level of Consciousness: drowsy  Airway & Oxygen Therapy: Patient Spontanous Breathing  Post-op Assessment: Report given to RN  Post vital signs: stable  Last Vitals:  Vitals Value Taken Time  BP    Temp    Pulse    Resp    SpO2      Last Pain:  Vitals:   09/14/21 0621  TempSrc: Oral  PainSc: 0-No pain         Complications: No notable events documented.

## 2021-09-14 NOTE — Anesthesia Preprocedure Evaluation (Addendum)
Anesthesia Evaluation  Patient identified by MRN, date of birth, ID band Patient awake    Reviewed: Allergy & Precautions, H&P , NPO status , Patient's Chart, lab work & pertinent test results  History of Anesthesia Complications Negative for: history of anesthetic complications  Airway Mallampati: II  TM Distance: >3 FB Neck ROM: full    Dental   Pulmonary neg pulmonary ROS, neg sleep apnea, neg COPD,    breath sounds clear to auscultation       Cardiovascular (-) angina(-) Past MI and (-) Cardiac Stents negative cardio ROS  (-) dysrhythmias  Rhythm:regular Rate:Normal     Neuro/Psych negative neurological ROS  negative psych ROS   GI/Hepatic negative GI ROS, Neg liver ROS,   Endo/Other  Obese BMI 32  Renal/GU      Musculoskeletal   Abdominal   Peds  Hematology negative hematology ROS (+)   Anesthesia Other Findings History reviewed. No pertinent past medical history.  Past Surgical History: No date: POPLITEAL SYNOVIAL CYST EXCISION No date: WISDOM TOOTH EXTRACTION  BMI    Body Mass Index: 31.93 kg/m      Reproductive/Obstetrics negative OB ROS                            Anesthesia Physical Anesthesia Plan  ASA: 2  Anesthesia Plan: General   Post-op Pain Management:    Induction:   PONV Risk Score and Plan: Ondansetron, Dexamethasone, Midazolam and Treatment may vary due to age or medical condition  Airway Management Planned: Oral ETT  Additional Equipment:   Intra-op Plan:   Post-operative Plan:   Informed Consent: I have reviewed the patients History and Physical, chart, labs and discussed the procedure including the risks, benefits and alternatives for the proposed anesthesia with the patient or authorized representative who has indicated his/her understanding and acceptance.     Dental Advisory Given  Plan Discussed with: Anesthesiologist, CRNA and  Surgeon  Anesthesia Plan Comments:        Anesthesia Quick Evaluation

## 2021-09-14 NOTE — Anesthesia Postprocedure Evaluation (Signed)
Anesthesia Post Note  Patient: Elise Gladden  Procedure(s) Performed: XI ROBOT ASSISTED UMBILICAL HERNIA REPAIR (Abdomen) INSERTION OF MESH  Patient location during evaluation: PACU Anesthesia Type: General Level of consciousness: awake and alert Pain management: pain level controlled Vital Signs Assessment: post-procedure vital signs reviewed and stable Respiratory status: spontaneous breathing, nonlabored ventilation and respiratory function stable Cardiovascular status: blood pressure returned to baseline and stable Postop Assessment: no apparent nausea or vomiting Anesthetic complications: no   No notable events documented.   Last Vitals:  Vitals:   09/14/21 1102 09/14/21 1205  BP: 132/78 107/68  Pulse: (!) 116 (!) 113  Resp: 16 20  Temp: (!) 36.1 C   SpO2: 97% 100%    Last Pain:  Vitals:   09/14/21 1205  TempSrc:   PainSc: 4                  Karleen Hampshire

## 2021-09-14 NOTE — H&P (Signed)
PATIENT PROFILE: Jesse Carter is a 40 y.o. male who presents to the OR for umbilical hernia repair.   PCP:  Yevonne Pax, MD   HISTORY OF PRESENT ILLNESS: Jesse Carter reports having an umbilical hernia since 2 years ago.  He endorses that he has been having more discomfort on the periumbilical area.  No pain radiation.  Aggravating factor is certain abdominal wall movement and lifting.  No alleviating factors.  Pain does not radiate to other part of the body.  Patient denies any previous surgery on the periumbilical area.  Patient denies any episode of abdominal distention nausea or vomiting.  No change on physical exam or history since last evaluation.      PROBLEM LIST: Problem List  Date Reviewed: 09/09/2019            Noted    B12 deficiency 11/28/2020    Overview      283, 2/22        History of 2019 novel coronavirus disease (COVID-19) 09/09/2019    Overview      12/20, 1/22               GENERAL REVIEW OF SYSTEMS:    General ROS: negative for - chills, fatigue, fever, weight gain or weight loss Allergy and Immunology ROS: negative for - hives  Hematological and Lymphatic ROS: negative for - bleeding problems or bruising, negative for palpable nodes Endocrine ROS: negative for - heat or cold intolerance, hair changes Respiratory ROS: negative for - cough, shortness of breath or wheezing Cardiovascular ROS: no chest pain or palpitations GI ROS: negative for nausea, vomiting, abdominal pain, diarrhea, constipation Musculoskeletal ROS: negative for - joint swelling or muscle pain Neurological ROS: negative for - confusion, syncope Dermatological ROS: negative for pruritus and rash Psychiatric: negative for anxiety, depression, difficulty sleeping and memory loss   MEDICATIONS:       Current Outpatient Medications  Medication Sig Dispense Refill   ascorbic acid, vitamin C, (VITAMIN C) 1000 MG tablet Take 1,000 mg by mouth once daily       cholecalciferol  (VITAMIN D3) 1000 unit tablet Take by mouth 4,ooo       Herbal Supplement Herbal Name:  Multi vitamin and zinc.        No current facility-administered medications for this visit.      ALLERGIES: Penicillin and Trimox [amoxicillin]   PAST MEDICAL HISTORY: History reviewed. No pertinent past medical history.   PAST SURGICAL HISTORY:      Past Surgical History:  Procedure Laterality Date   bakers cyst removed          FAMILY HISTORY:      Family History  Problem Relation Age of Onset   No Known Problems Mother     High blood pressure (Hypertension) Father        SOCIAL HISTORY: Social History         Socioeconomic History   Marital status: Married  Tobacco Use   Smoking status: Never Smoker   Smokeless tobacco: Never Used  Scientific laboratory technician Use: Never used  Substance and Sexual Activity   Alcohol use: Yes      Alcohol/week: 0.0 standard drinks      Comment: occasional   Drug use: Never   Sexual activity: Defer      Partners: Female      PHYSICAL EXAM:    Vitals:    07/25/21 1355  BP: 135/85  Pulse: 75  Body mass index is 33.51 kg/m. Weight: (!) 115.2 kg (254 lb)    GENERAL: Alert, active, oriented x3   HEENT: Pupils equal reactive to light. Extraocular movements are intact. Sclera clear. Palpebral conjunctiva normal red color.Pharynx clear.   NECK: Supple with no palpable mass and no adenopathy.   LUNGS: Sound clear with no rales rhonchi or wheezes.   HEART: Regular rhythm S1 and S2 without murmur.   ABDOMEN: Soft and depressible, nontender with no palpable mass, no hepatomegaly.  2 cm umbilical hernia defect, reducible.   EXTREMITIES: Well-developed well-nourished symmetrical with no dependent edema.   NEUROLOGICAL: Awake alert oriented, facial expression symmetrical, moving all extremities.   REVIEW OF DATA: I have reviewed the following data today:      Appointment on 07/17/2021  Component Date Value   Hemoglobin A1C 07/17/2021 5.7  (!)   Average Blood Glucose (C* 07/17/2021 117    Glucose 07/17/2021 100    Sodium 07/17/2021 141    Potassium 07/17/2021 4.1    Chloride 07/17/2021 102    Carbon Dioxide (CO2) 07/17/2021 30.4    Urea Nitrogen (BUN) 07/17/2021 17    Creatinine 07/17/2021 1.1    Glomerular Filtration Ra* 07/17/2021 75    Calcium 07/17/2021 9.5    AST  07/17/2021 16    ALT  07/17/2021 28    Alk Phos (alkaline Phosp* 07/17/2021 99    Albumin 07/17/2021 4.6    Bilirubin, Total 07/17/2021 0.8    Protein, Total 07/17/2021 6.8    A/G Ratio 07/17/2021 2.1    Hemoglobin 07/17/2021 16.4    Vitamin B12 07/17/2021 269 (!)      ASSESSMENT: Jesse Carter is a 40 y.o. male presenting for umbilical hernia repair.     The patient presents with a symptomatic umbilical hernia. Patient was oriented about the diagnosis of umbilical hernia and its implication. The patient was oriented about the treatment alternatives (observation vs surgical repair). Due to patient symptoms, repair is recommended. Patient oriented about the surgical procedure, the use of mesh and its risk of complications such as: infection, bleeding, injury to vasculature, injury to bowel or bladder, and chronic pain, intestinal obstruction, among others.    Umbilical hernia without obstruction and without gangrene [K42.9]   PLAN: Robotic assisted laparoscopic umbilical hernia repair with mesh (37106)    Patient verbalized understanding, all questions were answered, and were agreeable with the plan outlined above.      Herbert Pun, MD

## 2021-09-14 NOTE — Op Note (Signed)
Preoperative diagnosis: Umbilical Hernia   Postoperative diagnosis: Umbilical Hernia   Procedure: Robotic assisted laparoscopic transabdominal pre peritoneal umbilical hernia repair with mesh  Anesthesia: General   Surgeon: Carolan Shiver, MD, FACS   Wound Classification: Clean   Specimen: None   Complications: None   Estimated Blood Loss: 5 mL   Indications: A 40 year old male with symptomatic umbilical hernia. Repair indicated to improve pain and avoid complications such as incarceration or strangulation.    Findings: 2 cm umbilical hernia 2. Tension free repair achieved with 12 x 12 cm Progrip mesh 5. Adequate hemostasis   Description of procedure: The patient was brought to the operating room and general anesthesia was induced. A time-out was completed verifying correct patient, procedure, site, positioning, and implant(s) and/or special equipment prior to beginning this procedure. Antibiotics were administered prior to making the incision. SCDs placed. The anterior abdominal wall was prepped and draped in the standard sterile fashion.    Palmer's point chosen for entry.  Veress needle placed and abdomen insufflated to 15cm without any dramatic increase in pressure.  Needle removed and optiview technique used to place 8 mm port at same point.  No injury noted during placement. Two additional ports were placed along left lateral aspect.  Xi robot then docked into place.     A pre peritoneal flap was started 6 cm lateral to the umbilical defect. The pre peritoneal flap was extended 6 cm away from the hernia defect in all directions. The Hernia sac and content were reduced.    Insufflation dropped to 8 mm and transfacial suture with 0 stratafix used to primarily close defect under minimal tension. Progrip 12 x 12 cm  mesh was placed within the the pre peritoneal flap and self attached to the anterior abdominal wall centered over the defect.  The pre peritoneal flap was closed  using 2-0 VLock.     Robot was undocked.  Abdomen then desufflated while camera within abdomen to ensure no signs of new bleed prior to removing camera and rest of ports completely.  All skin incisions closed with 4-0 Monocryl in a subcuticular fashion.  All wounds then dressed with Dermabond.   Patient was then successfully awakened and transferred to PACU in stable condition.  At the end of the procedure sponge and instrument counts were correct.

## 2021-09-14 NOTE — Discharge Instructions (Addendum)
  Diet: Resume home heart healthy regular diet.   Activity: No heavy lifting >20 pounds (children, pets, laundry, garbage) or strenuous activity until follow-up, but light activity and walking are encouraged. Do not drive or drink alcohol if taking narcotic pain medications.  Wound care: May shower with soapy water and pat dry (do not rub incisions), but no baths or submerging incision underwater until follow-up. (no swimming)   Medications: Resume all home medications. For mild to moderate pain: acetaminophen (Tylenol) or ibuprofen (if no kidney disease). Combining Tylenol with alcohol can substantially increase your risk of causing liver disease. Narcotic pain medications, if prescribed, can be used for severe pain, though may cause nausea, constipation, and drowsiness. Do not combine Tylenol and Norco within a 6 hour period as Norco contains Tylenol. If you do not need the narcotic pain medication, you do not need to fill the prescription.  Call office [phone]) at any time if any questions, worsening pain, fevers/chills, bleeding, drainage from incision site, or other concerns.  TYLENOL 1,000 MG GIVEN AT THE HOSPITAL AT 7:53 AM.     AMBULATORY SURGERY  DISCHARGE INSTRUCTIONS   The drugs that you were given will stay in your system until tomorrow so for the next 24 hours you should not:  Drive an automobile Make any legal decisions Drink any alcoholic beverage   You may resume regular meals tomorrow.  Today it is better to start with liquids and gradually work up to solid foods.  You may eat anything you prefer, but it is better to start with liquids, then soup and crackers, and gradually work up to solid foods.   Please notify your doctor immediately if you have any unusual bleeding, trouble breathing, redness and pain at the surgery site, drainage, fever, or pain not relieved by medication.    Additional Instructions:   Please contact your physician with any problems  or Same Day Surgery at [phone], Monday through Friday 6 am to 4 pm, or [place] at [facility] number at [phone].

## 2021-09-17 ENCOUNTER — Encounter: Payer: Self-pay | Admitting: General Surgery

## 2022-02-15 ENCOUNTER — Encounter: Payer: Self-pay | Admitting: Urology

## 2022-02-15 ENCOUNTER — Ambulatory Visit (INDEPENDENT_AMBULATORY_CARE_PROVIDER_SITE_OTHER): Payer: BC Managed Care – PPO | Admitting: Urology

## 2022-02-15 VITALS — BP 126/79 | HR 91 | Ht 73.0 in | Wt 255.0 lb

## 2022-02-15 DIAGNOSIS — Z3009 Encounter for other general counseling and advice on contraception: Secondary | ICD-10-CM | POA: Diagnosis not present

## 2022-02-15 NOTE — Progress Notes (Signed)
? ?02/15/2022 ?9:48 AM  ? ?Jesse Carter ?[birth year] ?[id number] ? ?Referring provider: [facility] ?[address] ?[place],  [place] [address] ? ?Chief Complaint  ?Patient presents with  ? VAS Consult  ? ? ?HPI: ?Jesse Carter is a 41 y.o. male who presents for vasectomy counseling.  He presents today with his wife ? ?They have 3 children ?Denies prior history urologic problems including chronic scrotal pain, epididymitis or orchitis ?No previous history inguinal hernia or pelvic surgery ?No history of bleeding or clotting disorders ? ? ?PMH: ?History reviewed. No pertinent past medical history. ? ?Surgical History: ?Past Surgical History:  ?Procedure Laterality Date  ? INSERTION OF MESH  09/14/2021  ? Procedure: INSERTION OF MESH;  Surgeon: Carolan Shiver, MD;  Location: [facility] ORS;  Service: General;;  ? POPLITEAL SYNOVIAL CYST EXCISION    ? WISDOM TOOTH EXTRACTION    ? ? ?Home Medications:  ?Allergies as of 02/15/2022   ? ?   Reactions  ? Amoxicillin Rash  ? Tolerated 3rd generation cephalosporin (CEFDINIR) on 09/04/2017 without documented ADRs.   ? Penicillins Rash  ? Tolerated 3rd generation cephalosporin (CEFDINIR) on 09/04/2017 without documented ADRs.   ? ?  ? ?  ?Medication List  ?  ? ?  ? Accurate as of Feb 15, 2022  9:48 AM. If you have any questions, ask your nurse or doctor.  ?  ?  ? ?  ? ?fluticasone 50 MCG/ACT nasal spray ?Commonly known as: FLONASE ?Place 1 spray into both nostrils daily. ?  ?multivitamin with minerals tablet ?Take 1 tablet by mouth daily. ?  ?Nac 600 600 MG Caps ?Generic drug: Acetylcysteine ?Take 600 mg by mouth daily. ?  ?PROBIOTIC DAILY PO ?Take 1 drop by mouth daily. ?  ?VITAMIN C PO ?Take 1 tablet by mouth daily. ?  ?VITAMIN D3 PO ?Take by mouth. ?  ?zinc gluconate 50 MG tablet ?Take 50 mg by mouth daily. ?  ? ?  ? ? ?Allergies:  ?Allergies  ?Allergen Reactions  ? Amoxicillin Rash  ?  Tolerated 3rd generation cephalosporin (CEFDINIR) on 09/04/2017 without documented  ADRs.   ? Penicillins Rash  ?  Tolerated 3rd generation cephalosporin (CEFDINIR) on 09/04/2017 without documented ADRs.   ? ? ?Family History: ?Family History  ?Problem Relation Age of Onset  ? Hypertension Father   ? ? ?Social History:  reports that he has never smoked. He has never used smokeless tobacco. He reports current alcohol use. He reports that he does not use drugs. ? ? ?Physical Exam: ?BP 126/79   Pulse 91   Ht 6\' 1"  (1.854 m)   Wt 255 lb (115.7 kg)   BMI 33.64 kg/m?   ?Constitutional:  Alert and oriented, No acute distress. ?HEENT: [place] AT, moist mucus membranes.  Trachea midline, no masses. ?Cardiovascular: No clubbing, cyanosis, or edema. ?Respiratory: Normal respiratory effort, no increased work of breathing. ?GI: Abdomen is soft, nontender, nondistended, no abdominal masses ?GU: Phallus without lesions, testes descended bilaterally without masses or tenderness, spermatic cord/epididymis palpably normal bilaterally.  Vasa palpable bilaterally ?Skin: No rashes, bruises or suspicious lesions. ?Neurologic: Grossly intact, no focal deficits, moving all 4 extremities. ?Psychiatric: Normal mood and affect. ? ? ?Assessment & Plan:   ? ?1.  Undesired fertility ?Desires to schedule vasectomy ?We had a long discussion about vasectomy. We specifically discussed the procedure, recovery and the risks, benefits and alternatives of vasectomy. I explained that the procedure entails removal of a segment of each vas deferens, each of  which conducts sperm, and that the purpose of this procedure is to cause sterility (inability to produce children or cause pregnancy). Vasectomy is intended to be permanent and irreversible form of contraception. Options for fertility after vasectomy include vasectomy reversal, or sperm retrieval with in vitro fertilization. These options are not always successful, and they may be expensive. We discussed reversible forms of birth control such as condoms, IUD or diaphragms, as well as  the option of freezing sperm in a sperm bank prior to the vasectomy procedure. We discussed the importance of avoiding strenuous exercise for four days after vasectomy, and the importance of refraining from any form of ejaculation for seven days after vasectomy. I explained that vasectomy does not produce immediate sterility so another form of contraceptive must be used until sterility is assured by having semen checked for sperm. Thus, a post vasectomy semen analysis is necessary to confirm sterility. Rarely, vasectomy must be repeated. We discussed the approximately 1 in 2,000 risk of pregnancy after vasectomy for men who have post-vasectomy semen analysis showing absent sperm or rare non-motile sperm. Typical side effects include a small amount of oozing blood, some discomfort and mild swelling in the area of incision.  Vasectomy does not affect sexual performance, function, please, sensation, interest, desire, satisfaction, penile erection, volume of semen or ejaculation. Other rare risks include allergy or adverse reaction to an anesthetic, testicular atrophy, hematoma, infection/abscess, prolonged tenderness of the vas deferens, pain, swelling, painful nodule or scar (called sperm granuloma) or epididymtis. We discussed chronic testicular pain syndrome. This has been reported to occur in as many as 1-2% of men and may be permanent. This can be treated with medication, small procedures or (rarely) surgery. ?He indicated he would call back if he desires a preprocedure anxiolytic and would need a driver if utilizing ? ? ?Riki Altes, MD ? ?[facility] ?[address] ?[place], [place] [address] ?[phone] ? ?

## 2022-02-15 NOTE — Patient Instructions (Signed)

## 2022-03-22 ENCOUNTER — Ambulatory Visit: Payer: BC Managed Care – PPO | Admitting: Urology

## 2022-03-22 ENCOUNTER — Encounter: Payer: Self-pay | Admitting: Urology

## 2022-03-22 VITALS — BP 131/89 | HR 106 | Ht 73.0 in | Wt 255.0 lb

## 2022-03-22 DIAGNOSIS — Z302 Encounter for sterilization: Secondary | ICD-10-CM

## 2022-03-22 MED ORDER — HYDROCODONE-ACETAMINOPHEN 5-325 MG PO TABS
1.0000 | ORAL_TABLET | Freq: Four times a day (QID) | ORAL | 0 refills | Status: AC | PRN
Start: 2022-03-22 — End: ?

## 2022-03-22 NOTE — Patient Instructions (Signed)

## 2022-03-25 NOTE — Progress Notes (Signed)
Vasectomy Procedure Note  Indications: Jesse Carter is a 41 y.o. male who presents today for elective sterilization.  He has been consented for the procedure.  He is aware of the risks and benefits.  He had no additional questions.  He agrees to proceed.  He denies any other significant change since his last visit.  Pre-operative Diagnosis: Elective sterilization  Post-operative Diagnosis: Elective sterilization  Premedication: N/A  Surgeon: Lorin Picket C. Akul Leggette, M.D  Description: The patient was prepped and draped in the standard fashion.  The right vas deferens was identified and brought superiorly to the anterior scrotal skin.  The skin and vas were then anesthetized utilizing 7 ml 1% lidocaine.  A small stab incision was made and spread with the vas dissector.  The vas was grasped utilizing the vas clamp and elevated out of the incision.  The vas was dissected free from surrounding tissue and vessels and an ~1 cm segment was excised.  The vas lumens were cauterized utilizing electrocautery.  The distal segment was buried in the surrounding sheath with a 3-0 chromic suture.  No significant bleeding was observed.  The vas ends were then dropped back into the hemiscrotum.  The skin was closed with hemostatic pressure.  An identical procedure was performed on the contralateral side.  Clean dry gauze was applied to the incision sites.  The patient tolerated the procedure well.  Complications:None  Recommendations: 1.  No lifting greater than 10 pounds or strenuous activity for 1 week. 2.  Scrotal support for 1-2 weeks. 3.  May shower in 24 hours; no bath, hot tub for 1 week 4.  No intercourse for at least 7 days and resume based on level of discomfort  5.  Continue alternate contraception for 12 weeks.  6.  Call for significant pain, swelling, redness, drainage or fever greater than 100.5. 7.  Rx hydrocodone/APAP 5/325 1-2 every 6 hours prn pain. 8.  Follow-up semen analysis in 12  weeks.   Jesse Axon, MD

## 2022-06-28 ENCOUNTER — Other Ambulatory Visit: Payer: BC Managed Care – PPO

## 2022-06-28 DIAGNOSIS — Z302 Encounter for sterilization: Secondary | ICD-10-CM

## 2022-06-29 ENCOUNTER — Encounter: Payer: Self-pay | Admitting: Urology

## 2022-06-29 LAB — TEST CODE CHANGE

## 2022-06-29 LAB — POST-VAS SPERM EVALUATION,QUAL: Volume: 2.7 mL

## 2024-05-11 ENCOUNTER — Other Ambulatory Visit: Payer: Self-pay | Admitting: Chiropractic Medicine

## 2024-05-11 DIAGNOSIS — M9903 Segmental and somatic dysfunction of lumbar region: Secondary | ICD-10-CM

## 2024-05-11 DIAGNOSIS — M545 Low back pain, unspecified: Secondary | ICD-10-CM

## 2024-05-12 ENCOUNTER — Ambulatory Visit
Admission: RE | Admit: 2024-05-12 | Discharge: 2024-05-12 | Disposition: A | Discharge status: Home / Self Care | Source: Ambulatory Visit | Attending: Chiropractic Medicine | Admitting: Chiropractic Medicine

## 2024-05-12 DIAGNOSIS — M9903 Segmental and somatic dysfunction of lumbar region: Secondary | ICD-10-CM

## 2024-05-12 DIAGNOSIS — M545 Low back pain, unspecified: Secondary | ICD-10-CM
